# Patient Record
Sex: Male | Born: 2000 | Race: Black or African American | Hispanic: No | Marital: Single
Health system: Southern US, Community
[De-identification: ages and names within clinical notes are randomized; demographics above are authoritative.]

---
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
## 2022-08-14 ENCOUNTER — Other Ambulatory Visit: Payer: Self-pay

## 2022-08-14 ENCOUNTER — Emergency Department
Admission: EM | Admit: 2022-08-14 | Discharge: 2022-08-14 | Discharge status: Against Medical Advice | Payer: Medicaid Other | Attending: Emergency Medicine | Admitting: Emergency Medicine

## 2022-08-14 DIAGNOSIS — I10 Essential (primary) hypertension: Secondary | ICD-10-CM | POA: Insufficient documentation

## 2022-08-14 DIAGNOSIS — Z5329 Procedure and treatment not carried out because of patient's decision for other reasons: Secondary | ICD-10-CM | POA: Diagnosis not present

## 2022-08-14 LAB — BASIC METABOLIC PANEL
Anion gap: 7 (ref 5–15)
BUN: 16 mg/dL (ref 6–20)
CO2: 26 mmol/L (ref 22–32)
Calcium: 9 mg/dL (ref 8.9–10.3)
Chloride: 106 mmol/L (ref 98–111)
Creatinine, Ser: 0.89 mg/dL (ref 0.61–1.24)
GFR, Estimated: >60 mL/min (ref >60)
Glucose, Bld: 92 mg/dL (ref 70–99)
Potassium: 3.7 mmol/L (ref 3.5–5.1)
Sodium: 139 mmol/L (ref 135–145)

## 2022-08-14 LAB — CBC
HCT: 41.7 % (ref 39.0–52.0)
Hemoglobin: 14.4 g/dL (ref 13.0–17.0)
MCH: 28.8 pg (ref 26.0–34.0)
MCHC: 34.5 g/dL (ref 30.0–36.0)
MCV: 83.4 fL (ref 80.0–100.0)
Platelets: 260 10*3/uL (ref 150–400)
RBC: 5 MIL/uL (ref 4.22–5.81)
RDW: 12.3 % (ref 11.5–15.5)
WBC: 7.1 10*3/uL (ref 4.0–10.5)
nRBC: 0 % (ref 0.0–0.2)

## 2022-08-14 NOTE — ED Provider Notes (Signed)
[facility] Provider Note    Event Date/Time   First MD Initiated Contact with Patient 08/14/22 1448     (approximate)   History   Hypertension   HPI  Harry Jacobs is a 22 y.o. male with no significant past medical history presents with elevated blood pressure.  Patient reports he was having a headache and feeling somewhat lightheaded today at his work, they checked his blood pressure and found to be elevated and referred to the emergency department.  He feels well currently has no lightheadedness, no headache.  No neurodeficits.  No chest pain or palpitations.     Physical Exam   Triage Vital Signs: ED Triage Vitals [08/14/22 1408]  Enc Vitals Group     BP (!) 158/107     Pulse Rate 75     Resp 20     Temp 98.3 F (36.8 C)     Temp src      SpO2 96 %     Weight 97.1 kg (214 lb)     Height 1.626 m (5\' 4" )     Head Circumference      Peak Flow      Pain Score 0     Pain Loc      Pain Edu?      Excl. in GC?     Most recent vital signs: Vitals:   08/14/22 1408  BP: (!) 158/107  Pulse: 75  Resp: 20  Temp: 98.3 F (36.8 C)  SpO2: 96%     General: Awake, no distress.  CV:  Good peripheral perfusion.  Resp:  Normal effort.  Abd:  No distention.  Other:     ED Results / Procedures / Treatments   Labs (all labs ordered are listed, but only abnormal results are displayed) Labs Reviewed  CBC  BASIC METABOLIC PANEL     EKG  ED ECG REPORT I, Jene Every, the attending physician, personally viewed and interpreted this ECG.  Date: 08/14/2022  Rhythm: normal sinus rhythm QRS Axis: normal Intervals: normal ST/T Wave abnormalities: normal Narrative Interpretation: no evidence of acute ischemia    RADIOLOGY     PROCEDURES:  Critical Care performed:   Procedures   MEDICATIONS ORDERED IN ED: Medications - No data to display   IMPRESSION / MDM / ASSESSMENT AND PLAN / ED COURSE  I reviewed the triage vital  signs and the nursing notes. Patient's presentation is most consistent with acute illness / injury with system symptoms.  Patient presents with elevated blood pressure as described above, however he is asymptomatic and well-appearing here.  Lab work reviewed and is unremarkable.  Differential includes primary hypertension, elevated blood pressure related to illness or stress or anxiety  Overall given patient's reassuring clinical picture, unremarkable labs, normal EKG appropriate for discharge with outpatient follow-up, have instructed him to take blood pressure at home daily follow-up closely with PCP        FINAL CLINICAL IMPRESSION(S) / ED DIAGNOSES   Final diagnoses:  Hypertension, unspecified type     Rx / DC Orders   ED Discharge Orders          Ordered    Ambulatory Referral to Primary Care (Establish Care)        08/14/22 1519             Note:  This document was prepared using Dragon voice recognition software and may include unintentional dictation errors.   Jene Every, MD 08/14/22 [phone]

## 2022-08-14 NOTE — ED Triage Notes (Signed)
Pt to ED for HTN. Checked bp d/t h/a and dizziness at work earlier. Took ibuprofen PTA. Denies sx at this time.
